# Patient Record
Sex: Male | Born: 1995 | Race: White | Hispanic: No | Marital: Single
Health system: Southern US, Community
[De-identification: ages and names within clinical notes are randomized; demographics above are authoritative.]

---

## 2020-01-04 ENCOUNTER — Encounter (HOSPITAL_COMMUNITY): Payer: Self-pay | Admitting: Emergency Medicine

## 2020-01-04 ENCOUNTER — Emergency Department (HOSPITAL_COMMUNITY)
Admission: EM | Admit: 2020-01-04 | Discharge: 2020-01-04 | Disposition: A | Discharge status: Home / Self Care | Payer: Self-pay | Attending: Emergency Medicine | Admitting: Emergency Medicine

## 2020-01-04 ENCOUNTER — Emergency Department (HOSPITAL_COMMUNITY): Payer: Self-pay

## 2020-01-04 ENCOUNTER — Other Ambulatory Visit: Payer: Self-pay

## 2020-01-04 DIAGNOSIS — N132 Hydronephrosis with renal and ureteral calculous obstruction: Secondary | ICD-10-CM | POA: Insufficient documentation

## 2020-01-04 DIAGNOSIS — N50819 Testicular pain, unspecified: Secondary | ICD-10-CM | POA: Insufficient documentation

## 2020-01-04 DIAGNOSIS — D72829 Elevated white blood cell count, unspecified: Secondary | ICD-10-CM | POA: Insufficient documentation

## 2020-01-04 LAB — CBC
HCT: 47.7 % (ref 39.0–52.0)
Hemoglobin: 15.7 g/dL (ref 13.0–17.0)
MCH: 29.4 pg (ref 26.0–34.0)
MCHC: 32.9 g/dL (ref 30.0–36.0)
MCV: 89.3 fL (ref 80.0–100.0)
Platelets: 238 10*3/uL (ref 150–400)
RBC: 5.34 MIL/uL (ref 4.22–5.81)
RDW: 12.1 % (ref 11.5–15.5)
WBC: 23.8 10*3/uL — ABNORMAL HIGH (ref 4.0–10.5)
nRBC: 0 % (ref 0.0–0.2)

## 2020-01-04 LAB — URINALYSIS, ROUTINE W REFLEX MICROSCOPIC
Bacteria, UA: NONE SEEN
Bilirubin Urine: NEGATIVE
Glucose, UA: NEGATIVE mg/dL
Ketones, ur: 5 mg/dL — AB
Leukocytes,Ua: NEGATIVE
Nitrite: NEGATIVE
Protein, ur: 100 mg/dL — AB
RBC / HPF: >50 RBC/hpf — ABNORMAL HIGH (ref 0–5)
Specific Gravity, Urine: 1.03 (ref 1.005–1.030)
pH: 5 (ref 5.0–8.0)

## 2020-01-04 LAB — BASIC METABOLIC PANEL
Anion gap: 12 (ref 5–15)
BUN: 16 mg/dL (ref 6–20)
CO2: 26 mmol/L (ref 22–32)
Calcium: 9.6 mg/dL (ref 8.9–10.3)
Chloride: 104 mmol/L (ref 98–111)
Creatinine, Ser: 1.17 mg/dL (ref 0.61–1.24)
GFR calc Af Amer: >60 mL/min (ref >60)
GFR calc non Af Amer: >60 mL/min (ref >60)
Glucose, Bld: 158 mg/dL — ABNORMAL HIGH (ref 70–99)
Potassium: 4.5 mmol/L (ref 3.5–5.1)
Sodium: 142 mmol/L (ref 135–145)

## 2020-01-04 MED ORDER — ONDANSETRON 4 MG PO TBDP: 4.0000 mg | ORAL_TABLET | Freq: Three times a day (TID) | ORAL | 0 refills | Status: AC | PRN

## 2020-01-04 MED ORDER — KETOROLAC TROMETHAMINE 30 MG/ML IJ SOLN: 30.0000 mg | Freq: Once | INTRAMUSCULAR | Status: DC

## 2020-01-04 MED ORDER — MORPHINE SULFATE (PF) 4 MG/ML IV SOLN
4.0000 mg | Freq: Once | INTRAVENOUS | Status: AC
  Administered 2020-01-04: 4 mg via INTRAVENOUS
  Filled 2020-01-04: qty 1

## 2020-01-04 MED ORDER — HYDROCODONE-ACETAMINOPHEN 5-325 MG PO TABS: 1.0000 | ORAL_TABLET | ORAL | 0 refills | Status: AC | PRN

## 2020-01-04 MED ORDER — ONDANSETRON HCL 4 MG/2ML IJ SOLN
4.0000 mg | Freq: Once | INTRAMUSCULAR | Status: AC
  Administered 2020-01-04: 4 mg via INTRAVENOUS
  Filled 2020-01-04: qty 2

## 2020-01-04 MED ORDER — KETOROLAC TROMETHAMINE 30 MG/ML IJ SOLN
30.0000 mg | Freq: Once | INTRAMUSCULAR | Status: AC
  Administered 2020-01-04: 30 mg via INTRAMUSCULAR
  Filled 2020-01-04: qty 1

## 2020-01-04 MED ORDER — SODIUM CHLORIDE 0.9 % IV BOLUS
1000.0000 mL | Freq: Once | INTRAVENOUS | Status: AC
  Administered 2020-01-04: 1000 mL via INTRAVENOUS

## 2020-01-04 NOTE — ED Notes (Signed)
ED Provider at bedside. 

## 2020-01-04 NOTE — ED Triage Notes (Signed)
Patient has had R flank pain, pain in testicles and intermittent hematuria x4 days. One episode of emesis this morning, denies current nausea. Inguinal area without hernias, non-tender to palpation.

## 2020-01-04 NOTE — ED Provider Notes (Addendum)
[facility]-EMERGENCY DEPT Provider Note   CSN: [id number] Arrival date & time: 01/04/20  1003     History Chief Complaint  Patient presents with  . Flank Pain  . Hematuria    Ronald Vargas is a 24 y.o. male with no significant past medical history who presents for evaluation of right flank and testicular pain.  Patient with intermittent right flank pain over the last 3 days.  States he noticed right-sided testicular pain earlier today.  No prior history of kidney stones.  Patient states he is also been urinating blood over the last 4 days.  Penile discharge, redness, swelling.  No concerns for STDs.  No fever, chills, nausea, vomiting, chest pain, shortness of breath, abdominal pain, diarrhea, dysuria.  Denies additional aggravating or relieving factors. Rates pain a 2-3/10 currently.  History of obtained from patient and past medical records.  No interpreter is used.  HPI     History reviewed. No pertinent past medical history.  There are no problems to display for this patient.   History reviewed. No pertinent surgical history.     No family history on file.  Social History   Tobacco Use  . Smoking status: Not on file  Substance Use Topics  . Alcohol use: Not on file  . Drug use: Not on file    Home Medications Prior to Admission medications   Medication Sig Start Date End Date Taking? Authorizing Provider  Ensure Plus (ENSURE PLUS) LIQD Take 237 mLs by mouth in the morning and at bedtime.   Yes [provider]  HYDROcodone-acetaminophen (NORCO/VICODIN) 5-325 MG tablet Take 1 tablet by mouth every 4 (four) hours as needed. 01/04/20   Shannette Tabares A, PA-C  ondansetron (ZOFRAN ODT) 4 MG disintegrating tablet Take 1 tablet (4 mg total) by mouth every 8 (eight) hours as needed for nausea or vomiting. 01/04/20   Esmirna Ravan A, PA-C    Allergies    Sulfa antibiotics  Review of Systems   Review of Systems  Constitutional: Negative.    HENT: Negative.   Respiratory: Negative.   Cardiovascular: Negative.   Gastrointestinal: Negative.   Genitourinary: Positive for flank pain, hematuria and testicular pain. Negative for decreased urine volume, difficulty urinating, discharge, dysuria, frequency, genital sores, penile pain, penile swelling, scrotal swelling and urgency.  Musculoskeletal: Negative for arthralgias, back pain, gait problem, joint swelling, myalgias, neck pain and neck stiffness.  Skin: Negative.   Neurological: Negative.   All other systems reviewed and are negative.   Physical Exam Updated Vital Signs BP (!) 149/79   Pulse 73   Temp 97.6 F (36.4 C) (Oral)   Resp 16   SpO2 96%   Physical Exam Vitals and nursing note reviewed. Exam conducted with a chaperone present.  Constitutional:      General: He is not in acute distress.    Appearance: He is well-developed. He is not ill-appearing, toxic-appearing or diaphoretic.  HENT:     Head: Normocephalic and atraumatic.     Nose: Nose normal.     Mouth/Throat:     Mouth: Mucous membranes are moist.  Eyes:     Pupils: Pupils are equal, round, and reactive to light.  Cardiovascular:     Rate and Rhythm: Normal rate and regular rhythm.     Pulses: Normal pulses.     Heart sounds: Normal heart sounds.  Pulmonary:     Effort: Pulmonary effort is normal. No respiratory distress.     Breath sounds: Normal  breath sounds.  Abdominal:     General: There is no distension.     Palpations: Abdomen is soft. There is no mass.     Tenderness: There is abdominal tenderness. There is right CVA tenderness. There is no left CVA tenderness, guarding or rebound.     Hernia: No hernia is present.     Comments: Tenderness to right CVA.  Abdomen soft, nontender.  No rebound or guarding  Genitourinary:    Penis: Normal.      Testes: Cremasteric reflex is present.        Right: Tenderness present. Mass, swelling, testicular hydrocele or varicocele not present. Right  testis is descended. Cremasteric reflex is present.      Epididymis:     Right: Normal.     Left: Normal.     Comments: Mild right testicular tenderness. No edema, erythema or warmth. No penile discharge.  No penile discharge, rashes or lesions. No evidence of strangulated or incarcerated hernia Musculoskeletal:        General: Normal range of motion.     Cervical back: Normal range of motion and neck supple.  Skin:    General: Skin is warm and dry.     Capillary Refill: Capillary refill takes less than 2 seconds.     Comments: No overlying skin changes no rashes or lesions  Neurological:     Mental Status: He is alert.     Comments: No facial droop. Ambulatory without difficulty     ED Results / Procedures / Treatments   Labs (all labs ordered are listed, but only abnormal results are displayed) Labs Reviewed  URINALYSIS, ROUTINE W REFLEX MICROSCOPIC - Abnormal; Notable for the following components:      Result Value   Color, Urine AMBER (*)    APPearance HAZY (*)    Hgb urine dipstick LARGE (*)    Ketones, ur 5 (*)    Protein, ur 100 (*)    RBC / HPF >50 (*)    All other components within normal limits  BASIC METABOLIC PANEL - Abnormal; Notable for the following components:   Glucose, Bld 158 (*)    All other components within normal limits  CBC - Abnormal; Notable for the following components:   WBC 23.8 (*)    All other components within normal limits    EKG None  Radiology CT Renal Stone Study  Result Date: 01/04/2020 CLINICAL DATA:  Flank pain, suspected kidney stone EXAM: CT ABDOMEN AND PELVIS WITHOUT CONTRAST TECHNIQUE: Multidetector CT imaging of the abdomen and pelvis was performed following the standard protocol without IV contrast. COMPARISON:  None FINDINGS: Lower chest: Basilar atelectasis on the LEFT.  No pleural effusion. Hepatobiliary: Liver is normal in size and contour. No pericholecystic stranding. No gross biliary ductal dilation. Pancreas: No sign of  inflammation, contour abnormality or gross ductal distension. Spleen: Spleen is normal in size and contour. Adrenals/Urinary Tract: Adrenal glands are normal. Mild RIGHT-sided perinephric stranding and mild RIGHT renal pelvic and ureteral distension with mild hydronephrosis 3 x 5 mm RIGHT mid ureteral calculus. Urinary bladder is under distended. 2-3 mm calculus in the a upper pole of the LEFT kidney. No additional RIGHT renal calculi. Low-density lesion, well-circumscribed in the upper pole the LEFT kidney measures water density likely a cyst approximately 1 cm. Stomach/Bowel: No acute gastrointestinal process. The appendix is normal. Vascular/Lymphatic: No signs of atherosclerotic calcification of the abdominal aorta. No aneurysmal dilation. No abdominal adenopathy. No pelvic adenopathy. Reproductive: Nonspecific appearance of  the prostate on CT without acute findings. Other: No ascites.  No free air.  No abdominal wall hernia. Musculoskeletal: No acute bone finding or destructive bone process. IMPRESSION: 1. 3 x 5 mm RIGHT mid ureteral calculus with mild RIGHT hydronephrosis, ureteral dilation and perinephric stranding. 2. 2-3 mm calculus in the upper pole of the LEFT kidney. 3. Low-density lesion, well-circumscribed in the upper pole the LEFT kidney measures water density likely a cyst. Electronically Signed   By: Donzetta Kohut M.D.   On: 01/04/2020 16:48    Procedures Procedures (including critical care time)  Medications Ordered in ED Medications  ketorolac (TORADOL) 30 MG/ML injection 30 mg (has no administration in time range)  sodium chloride 0.9 % bolus 1,000 mL (0 mLs Intravenous Stopped 01/04/20 1708)  ondansetron (ZOFRAN) injection 4 mg (4 mg Intravenous Given 01/04/20 1454)  morphine 4 MG/ML injection 4 mg (4 mg Intravenous Given 01/04/20 1454)    ED Course  I have reviewed the triage vital signs and the nursing notes.  Pertinent labs & imaging results that were available during my care  of the patient were reviewed by me and considered in my medical decision making (see chart for details).  24 year old male presents for evaluation of right flank, right testicular pain and hematuria over the last 4 days.  No history of stones.  He is afebrile, nonseptic, non-ill-appearing.  Abdomen soft, nontender.  He does have some mild tenderness to his right CVA however negative tap.  No dysuria, urinary frequency.  Right testicle some mild tenderness however no edema, erythema or warmth.  No penile discharge.  Cremasteric reflex present.  Plan on labs, imaging and reassess  Labs and imaging personally viewed and interpreted: CBC with leukocytosis at 23.8, question reactive?  He is afebrile without tachycardia, tachypnea or hypoxia.  Low suspicion for sepsis. Metabolic panel with mild hyperglycemia to 158 however no additional electrolyte, renal or liver abnormality Urinalysis negative for infection however large blood. CT Stone with 5 mm right ureteral stone. Has some mild hydronephrosis.  Patient reassessed. Tolerating p.o. intake without difficulty. Pain well controlled in ED. Discussed CT findings of ureteral stone. Will DC home with symptomatic management. He will follow-up with urology. Does have some leukocytosis however does not appear septic or ill. Has no tachycardia, tachypnea or hypoxia. His urinalysis does not show infection. Kidney function within normal limits.  The patient has been appropriately medically screened and/or stabilized in the ED. I have low suspicion for any other emergent medical condition which would require further screening, evaluation or treatment in the ED or require inpatient management.  Patient is hemodynamically stable and in no acute distress.  Patient able to ambulate in department prior to ED.  Evaluation does not show acute pathology that would require ongoing or additional emergent interventions while in the emergency department or further inpatient  treatment.  I have discussed the diagnosis with the patient and answered all questions.  Pain is been managed while in the emergency department and patient has no further complaints prior to discharge.  Patient is comfortable with plan discussed in room and is stable for discharge at this time.  I have discussed strict return precautions for returning to the emergency department.  Patient was encouraged to follow-up with PCP/specialist refer to at discharge.    MDM Rules/Calculators/A&P                           Final Clinical Impression(s) / ED  Diagnoses Final diagnoses:  Leukocytosis, unspecified type  Ureteral stone with hydronephrosis    Rx / DC Orders ED Discharge Orders         Ordered    HYDROcodone-acetaminophen (NORCO/VICODIN) 5-325 MG tablet  Every 4 hours PRN     Discontinue  Reprint     01/04/20 1705    ondansetron (ZOFRAN ODT) 4 MG disintegrating tablet  Every 8 hours PRN     Discontinue  Reprint     01/04/20 1705           Hendrixx Severin A, PA-C 01/04/20 1710    Olivene Cookston A, PA-C 01/04/20 1711    Linwood Dibbles, MD 01/06/20 [phone]

## 2020-01-04 NOTE — Discharge Instructions (Addendum)
Take the pain medicine as prescribed. Zofran is a nausea medicine. Drink plenty of fluids.  Unfortunately you cannot take the medication the dilated ureters due to your sulfa antibiotic allergy. Follow-up with urology in the next 1 to 2 days. You may call them tomorrow to let you know you are seen here.  I have written you a work note in case you are not able to go to work. If you do not need this you may toss the note

## 2021-09-03 IMAGING — CT CT RENAL STONE PROTOCOL
2 of 4 series · 16 of 46 positions shown, 18 images · non-contrast
Comparison: None

CLINICAL DATA: Flank pain, suspected kidney stone

EXAM:
CT ABDOMEN AND PELVIS WITHOUT CONTRAST
TECHNIQUE: Multidetector CT imaging of the abdomen and pelvis was performed
following the standard protocol without IV contrast.

[Series 2: axial st · axial · 0.98mm/px · z∈[-616,-86]mm · 13 of 120 slices shown, 15 images]
[im 7/120  soft-tissue]
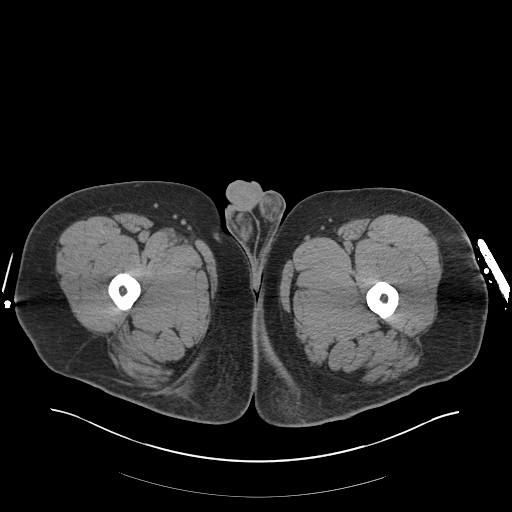
[im 7/120  bone]
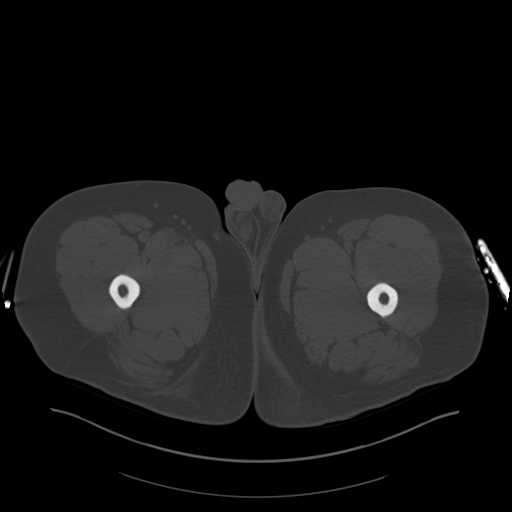
[im 19/120  soft-tissue]
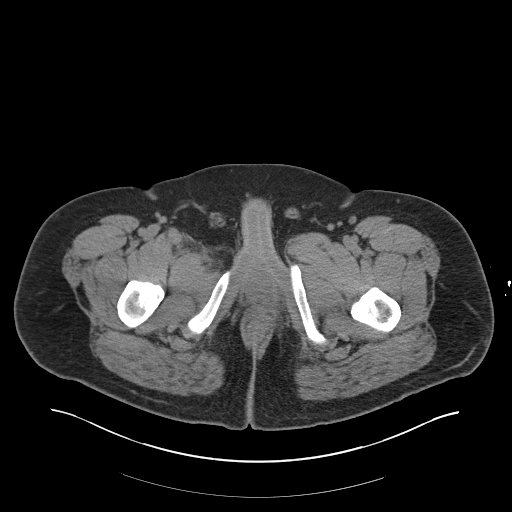
[im 26/120  soft-tissue]
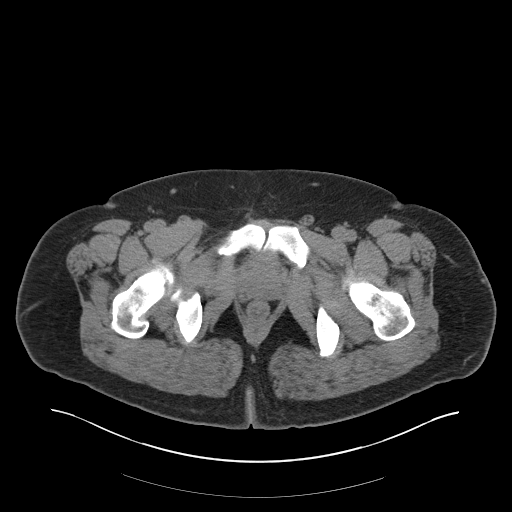
[im 32/120  soft-tissue]
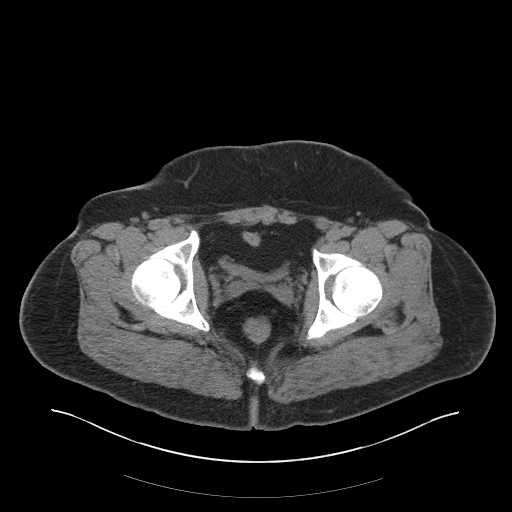
[im 44/120  soft-tissue]
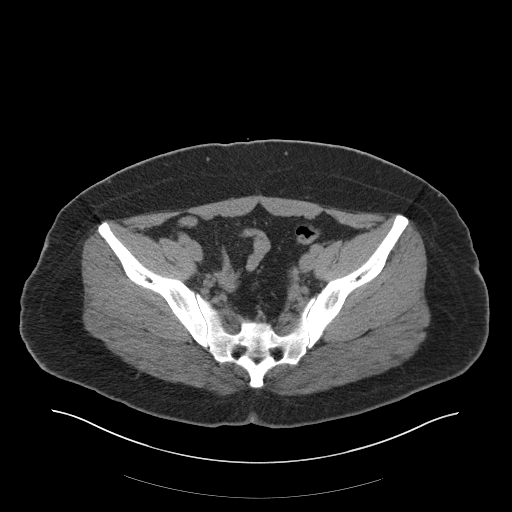
[im 51/120  soft-tissue]
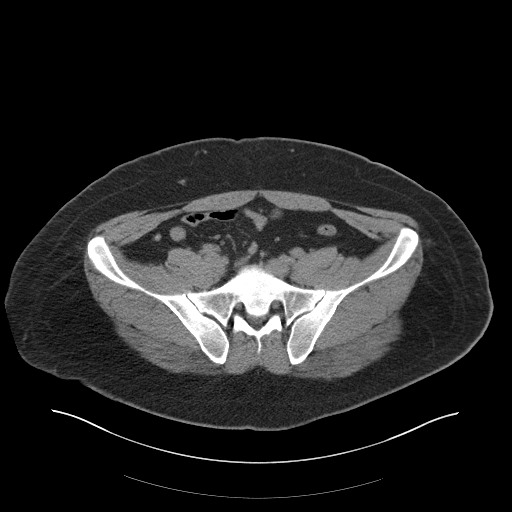
[im 63/120  soft-tissue]
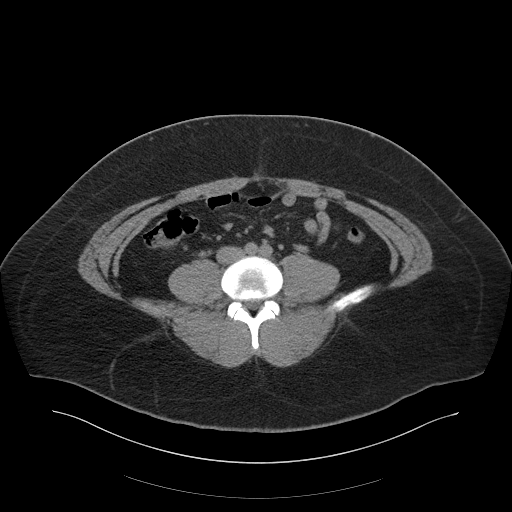
[im 69/120  soft-tissue]
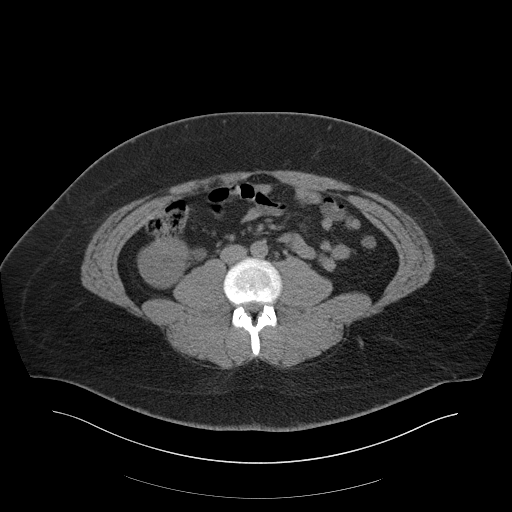
[im 76/120  soft-tissue]
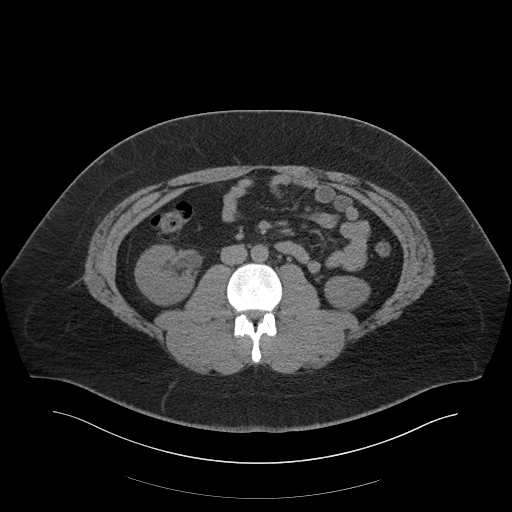
[im 76/120  bone]
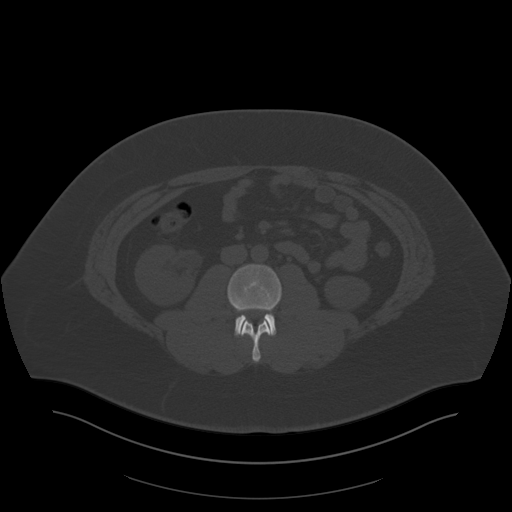
[im 88/120  soft-tissue]
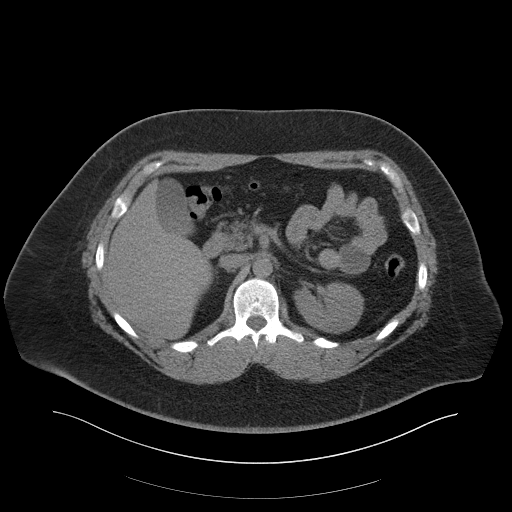
[im 94/120  soft-tissue]
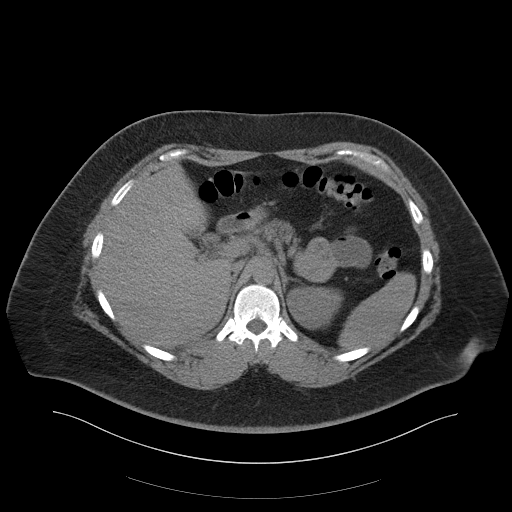
[im 101/120  soft-tissue]
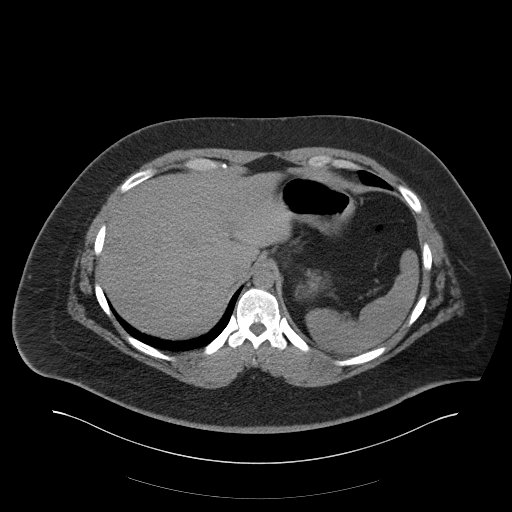
[im 113/120  soft-tissue]
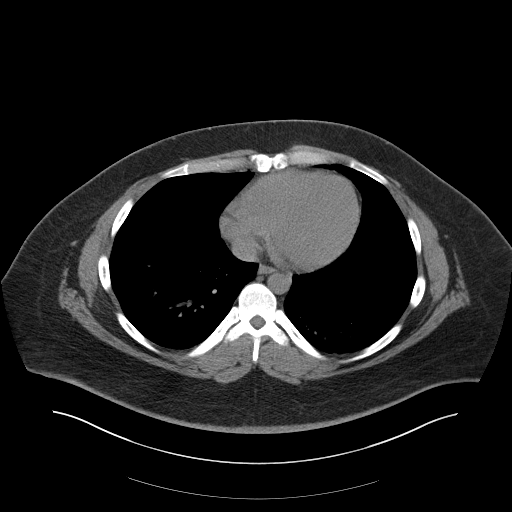

[Series 4: coronal · coronal · 0.90mm/px · 3 of 161 slices shown]
[im 54/161  soft-tissue]
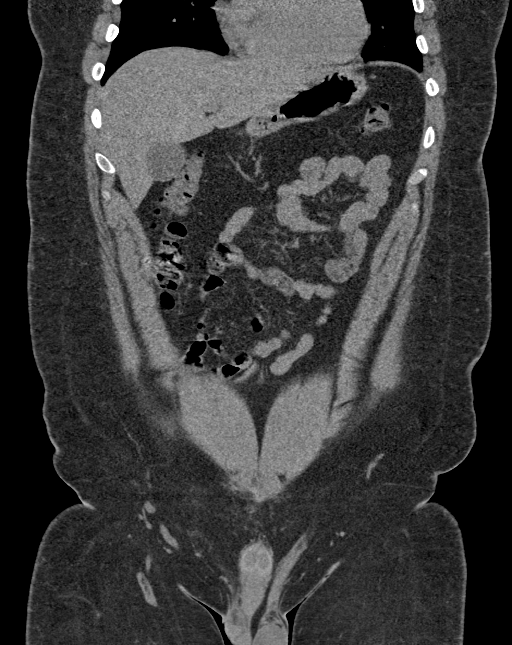
[im 72/161  soft-tissue]
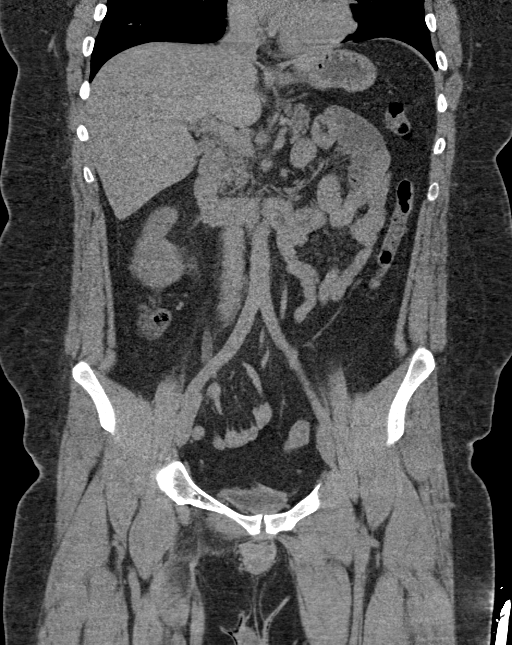
[im 89/161  soft-tissue]
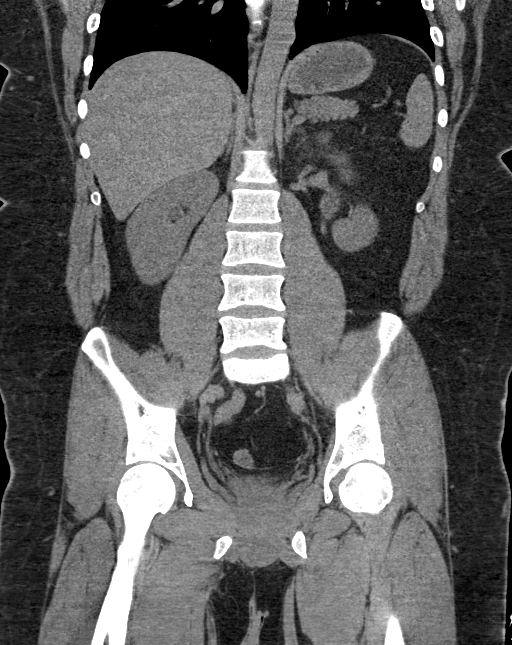

[16 of 46 positions shown; findings below may reference images not displayed]

FINDINGS: Lower chest: Basilar atelectasis on the LEFT.  No pleural effusion.

Hepatobiliary: Liver is normal in size and contour. No
pericholecystic stranding. No gross biliary ductal dilation.

Pancreas: No sign of inflammation, contour abnormality or gross
ductal distension.

Spleen: Spleen is normal in size and contour.

Adrenals/Urinary Tract: Adrenal glands are normal.

Mild RIGHT-sided perinephric stranding and mild RIGHT renal pelvic
and ureteral distension with mild hydronephrosis

3 x 5 mm RIGHT mid ureteral calculus. Urinary bladder is under
distended. 2-3 mm calculus in the a upper pole of the LEFT kidney.
No additional RIGHT renal calculi.

Low-density lesion, well-circumscribed in the upper pole the LEFT
kidney measures water density likely a cyst approximately 1 cm.

Stomach/Bowel: No acute gastrointestinal process. The appendix is
normal.

Vascular/Lymphatic: No signs of atherosclerotic calcification of the
abdominal aorta. No aneurysmal dilation. No abdominal adenopathy.

No pelvic adenopathy.

Reproductive: Nonspecific appearance of the prostate on CT without
acute findings.

Other: No ascites.  No free air.  No abdominal wall hernia.

Musculoskeletal: No acute bone finding or destructive bone process.
IMPRESSION: 1. 3 x 5 mm RIGHT mid ureteral calculus with mild RIGHT
hydronephrosis, ureteral dilation and perinephric stranding.
2. 2-3 mm calculus in the upper pole of the LEFT kidney.
3. Low-density lesion, well-circumscribed in the upper pole the LEFT
kidney measures water density likely a cyst.

## 2022-12-31 ENCOUNTER — Emergency Department (HOSPITAL_COMMUNITY)
Admission: EM | Admit: 2022-12-31 | Discharge: 2023-01-05 | Disposition: E | Payer: Self-pay | Attending: Emergency Medicine | Admitting: Emergency Medicine

## 2022-12-31 DIAGNOSIS — W3400XA Accidental discharge from unspecified firearms or gun, initial encounter: Secondary | ICD-10-CM | POA: Insufficient documentation

## 2022-12-31 DIAGNOSIS — S21132A Puncture wound without foreign body of left front wall of thorax without penetration into thoracic cavity, initial encounter: Secondary | ICD-10-CM | POA: Insufficient documentation

## 2022-12-31 DIAGNOSIS — I469 Cardiac arrest, cause unspecified: Secondary | ICD-10-CM | POA: Insufficient documentation

## 2022-12-31 MED ORDER — EPINEPHRINE 1 MG/10ML IJ SOSY
PREFILLED_SYRINGE | INTRAMUSCULAR | Status: DC | PRN
  Administered 2022-12-31: 1 mg via INTRAVENOUS

## 2023-01-05 NOTE — ED Notes (Signed)
Patient time of death occurred at 85. Pronounced by Dr. Janee Morn
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
## 2023-01-05 NOTE — ED Notes (Addendum)
[facility] notified ([phone])
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
## 2023-01-05 NOTE — ED Notes (Signed)
Bed placement notified 
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
## 2023-01-05 DEATH — deceased
